# Patient Record
Sex: Female | Born: 1995 | Race: White | Hispanic: No | Marital: Single | Smoking: Never smoker
Health system: Southern US, Community
[De-identification: ages and names within clinical notes are randomized; demographics above are authoritative.]

## PROBLEM LIST (undated history)

## (undated) DIAGNOSIS — R569 Unspecified convulsions: Secondary | ICD-10-CM

## (undated) DIAGNOSIS — I219 Acute myocardial infarction, unspecified: Secondary | ICD-10-CM

## (undated) DIAGNOSIS — F419 Anxiety disorder, unspecified: Secondary | ICD-10-CM

## (undated) HISTORY — DX: Anxiety disorder, unspecified: F41.9

## (undated) HISTORY — DX: Unspecified convulsions: R56.9

## (undated) HISTORY — PX: WISDOM TOOTH EXTRACTION: SHX21

## (undated) HISTORY — DX: Acute myocardial infarction, unspecified: I21.9

---
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
## 2021-07-25 ENCOUNTER — Encounter: Payer: Self-pay | Admitting: Physician Assistant

## 2021-07-25 ENCOUNTER — Ambulatory Visit (INDEPENDENT_AMBULATORY_CARE_PROVIDER_SITE_OTHER): Payer: BC Managed Care – PPO | Admitting: Physician Assistant

## 2021-07-25 VITALS — BP 123/74 | HR 99 | Temp 99.3°F | Resp 16 | Ht 61.0 in | Wt 108.3 lb

## 2021-07-25 DIAGNOSIS — R569 Unspecified convulsions: Secondary | ICD-10-CM | POA: Diagnosis not present

## 2021-07-25 DIAGNOSIS — H6593 Unspecified nonsuppurative otitis media, bilateral: Secondary | ICD-10-CM | POA: Diagnosis not present

## 2021-07-25 DIAGNOSIS — F419 Anxiety disorder, unspecified: Secondary | ICD-10-CM | POA: Diagnosis not present

## 2021-07-25 DIAGNOSIS — J301 Allergic rhinitis due to pollen: Secondary | ICD-10-CM | POA: Diagnosis not present

## 2021-07-25 DIAGNOSIS — Z7689 Persons encountering health services in other specified circumstances: Secondary | ICD-10-CM

## 2021-07-25 MED ORDER — FLUTICASONE PROPIONATE 50 MCG/ACT NA SUSP: 2.0000 | Freq: Every day | NASAL | 6 refills | Status: DC

## 2021-07-25 NOTE — Progress Notes (Signed)
? ?I,Andrea Poole,acting as a Neurosurgeon for [employer], PA-C.,have documented all relevant documentation on the behalf of Andrea Lat, PA-C,as directed by  [employer], PA-C while in the presence of [employer], PA-C. ? ?New Patient Office Visit ? ?Subjective   ? ?Patient ID: Andrea Poole, female    DOB: Jun 20, 1995  Age: 26 y.o. MRN: [id number] ? ?CC:  ?Chief Complaint  ?Patient presents with  ? Establish Care  ? ? ?HPI ?[address] [place] presents to establish care. Reports no complains or concerns.   ?Patient has never had pap. LMP 07-21-21  ?Patient states she had Tetanus in 2018 at [facility] ? ?Anxiety ?Takes Celexa. Compliant with medication. ? ?Seasonal  allergy ?Endorses having nasal congestion, rhinorrhea, ear fullness and pressure. Denies having fever, sinus pressure or pain. Denies having watery eye discharge or itchy eyes. ? ?Seizure ?Was diagnosed with seizure in 2016 after an episode of seizure was witnessed by her mother. Her recent episode of seizure was 2 months ago. She cannot drive due to 6 mo restriction per [facility] statutes ? ? ? ?  07/25/2021  ? 10:09 AM  ?GAD 7 : Generalized Anxiety Score  ?Nervous, Anxious, on Edge 0  ?Control/stop worrying 0  ?Worry too much - different things 0  ?Trouble relaxing 0  ?Restless 0  ?Easily annoyed or irritable 0  ?Afraid - awful might happen 0  ?Total GAD 7 Score 0  ?Anxiety Difficulty Not difficult at all  ? ?  ?Outpatient Encounter Medications as of 07/25/2021  ?Medication Sig  ? citalopram (CELEXA) 20 MG tablet Take 20 tablets by mouth 1 day or 1 dose.  ? ibuprofen (ADVIL,MOTRIN) 200 MG tablet Take 200 mg by mouth every 6 (six) hours as needed.  ? lamoTRIgine (LAMICTAL) 200 MG tablet Take 200 tablets by mouth in the morning and at bedtime.  ? lamoTRIgine (LAMICTAL) 25 MG tablet Take 50 mg by mouth daily.  ? norgestimate-ethinyl estradiol (ORTHO-CYCLEN) 0.25-35 MG-MCG tablet   ? levETIRAcetam (KEPPRA) 500 MG tablet  Take 1 tablet (500 mg total) by mouth 2 (two) times daily.  ? ?No facility-administered encounter medications on file as of 07/25/2021.  ? ? ?Past Medical History:  ?Diagnosis Date  ? Anxiety   ? Seizures (HCC)   ? ? ?Past Surgical History:  ?Procedure Laterality Date  ? WISDOM TOOTH EXTRACTION    ? ? ?Family History  ?Problem Relation Age of Onset  ? Diabetes Mother   ? Diabetes Maternal Grandmother   ? ? ?Social History  ? ?Socioeconomic History  ? Marital status: Single  ?  Spouse name: Not on file  ? Number of children: Not on file  ? Years of education: Not on file  ? Highest education level: Not on file  ?Occupational History  ? Not on file  ?Tobacco Use  ? Smoking status: Never  ? Smokeless tobacco: Not on file  ?Substance and Sexual Activity  ? Alcohol use: No  ? Drug use: No  ? Sexual activity: Yes  ?  Birth control/protection: Pill  ?Other Topics Concern  ? Not on file  ?Social History Narrative  ? Not on file  ? ?Social Determinants of Health  ? ?Financial Resource Strain: Not on file  ?Food Insecurity: Not on file  ?Transportation Needs: Not on file  ?Physical Activity: Not on file  ?Stress: Not on file  ?Social Connections: Not on file  ?Intimate Partner Violence: Not on file  ? ? ?Review of Systems  ?  Constitutional: Negative.   ?Respiratory: Negative.    ?Cardiovascular: Negative.   ?Gastrointestinal: Negative.   ?Genitourinary: Negative.   ?All other systems reviewed and are negative. ? ? Except HPI ? ? ?Objective   ? ?BP 123/74 (BP Location: Right Arm, Patient Position: Sitting, Cuff Size: Normal)   Pulse 99   Temp 99.3 ?F (37.4 ?C) (Oral)   Resp 16   Ht 5\' 1"  (1.549 m)   Wt 108 lb 4.8 oz (49.1 kg)   LMP 07/20/2021 Comment: monthly  SpO2 100%   BMI 20.46 kg/m?  ? ?Physical Exam ?Vitals and nursing note reviewed.  ?Constitutional:   ?   Appearance: Normal appearance.  ?HENT:  ?   Head: Normocephalic and atraumatic.  ?   Ears:  ?   Comments: Fluids behind TMs ?   Nose: Congestion and rhinorrhea  present.  ?   Mouth/Throat:  ?   Mouth: Mucous membranes are moist.  ?   Pharynx: No oropharyngeal exudate.  ?Eyes:  ?   Extraocular Movements: Extraocular movements intact.  ?   Conjunctiva/sclera: Conjunctivae normal.  ?   Pupils: Pupils are equal, round, and reactive to light.  ?Cardiovascular:  ?   Rate and Rhythm: Normal rate and regular rhythm.  ?   Pulses: Normal pulses.  ?   Heart sounds: Normal heart sounds.  ?Pulmonary:  ?   Effort: Pulmonary effort is normal.  ?   Breath sounds: Normal breath sounds.  ?Abdominal:  ?   General: Abdomen is flat. Bowel sounds are normal.  ?   Palpations: Abdomen is soft.  ?Musculoskeletal:     ?   General: Normal range of motion.  ?   Cervical back: Normal range of motion.  ?Neurological:  ?   Mental Status: She is alert and oriented to person, place, and time.  ?Psychiatric:     ?   Behavior: Behavior normal.     ?   Thought Content: Thought content normal.     ?   Judgment: Judgment normal.  ? ?Assessment & Plan:  ? ?Problem List Items Addressed This Visit   ?None ?Visit Diagnoses   ? ? Anxiety    -  Primary  ? Relevant Medications  ? citalopram (CELEXA) 20 MG tablet  ? Seizures (HCC)      ? Relevant Medications  ? lamoTRIgine (LAMICTAL) 200 MG tablet  ? lamoTRIgine (LAMICTAL) 25 MG tablet  ? Seasonal allergic rhinitis due to pollen      ? Otitis media with effusion, bilateral      ? Encounter to establish care      ? ?  ?1. Anxiety ?GAD7 score is 0 ?Chronic. Stable ?Continue Celexa ? ?2. Seizures (HCC) ?Continue Lamotrigine ?Managed by Neurology. ? ?3. Seasonal allergic rhinitis due to pollen ?Advised taking OTC antihistamines daily. ?Nasal irrigation with saline.  ?Intranasal corticosteroid ? ?4. Otitis media with effusion, bilateral ?Nasal irrigation with saline. ?Intranasal corticosteroid ? ?5. Encounter to establish care ? ?CPE in 3 mo. Pap smear and Labs if needed ? ?The patient was advised to call back or seek an in-person evaluation if the symptoms worsen or if the  condition fails to improve as anticipated. ? ?I discussed the assessment and treatment plan with the patient. The patient was provided an opportunity to ask questions and all were answered. The patient agreed with the plan and demonstrated an understanding of the instructions. ? ?The entirety of the information documented in the History of Present Illness, Review of Systems  and Physical Exam were personally obtained by me. Portions of this information were initially documented by the CMA and reviewed by me for thoroughness and accuracy.   ? ?Portions of this note were created using dictation software and may contain typographical errors.   ? ?Andrea LatJanna Imanuel Pruiett, PA-C ? ? ?

## 2021-07-25 NOTE — Patient Instructions (Signed)
  °  Per [place] DMV statutes, patients with seizures are not allowed to drive until they have been seizure-free for six months. Use caution when using heavy equipment or power tools. Avoid working on ladders or at heights. Take showers instead of baths. Ensure the water temperature is not too high on the home water heater. Do not go swimming alone. When caring for infants or small children, sit down when holding, feeding, or changing them to minimize risk of injury to the child in the event you have a seizure. °

## 2021-10-01 ENCOUNTER — Telehealth: Payer: Self-pay

## 2021-10-01 DIAGNOSIS — G40909 Epilepsy, unspecified, not intractable, without status epilepticus: Secondary | ICD-10-CM

## 2021-10-01 NOTE — Telephone Encounter (Unsigned)
Copied from CRM [id number]. Topic: Referral - Question >> Oct 01, 2021  3:59 PM Tiffany B wrote: Patient would like to be referred to [facility] either [facility] or [facility] due to her seizures. Patient states she currently sees DeForest E. Malvin Johns, MD at [facility]. Patient states her last seizure was last  Wednesday and she saw her Neurologist. Patient states she has been seeing Dr. Malvin Johns for awhile but he stopping doing test therefore she would like PCP to refer to a new neurologist. Please advise

## 2021-10-02 ENCOUNTER — Other Ambulatory Visit: Payer: Self-pay | Admitting: Physician Assistant

## 2021-10-02 NOTE — Telephone Encounter (Signed)
Referral was placed. Please, let pt know. Thank you

## 2021-10-02 NOTE — Addendum Note (Signed)
Addended by: Debera Lat on: 10/02/2021 03:02 PM   Modules accepted: Orders

## 2021-10-03 ENCOUNTER — Other Ambulatory Visit: Payer: Self-pay | Admitting: Physician Assistant

## 2021-10-03 DIAGNOSIS — Z30019 Encounter for initial prescription of contraceptives, unspecified: Secondary | ICD-10-CM

## 2021-10-03 MED ORDER — NORGESTIMATE-ETH ESTRADIOL 0.25-35 MG-MCG PO TABS: 1.0000 | ORAL_TABLET | Freq: Every day | ORAL | 11 refills | Status: DC

## 2021-10-03 NOTE — Telephone Encounter (Signed)
Pt is requesting that Strintec 28 day pack be sent to [facility] [address] [place] - [address]  [address] [place] [address]  Phone:  [phone]  Fax:  [phone]  She states that you have never prescribed this for her but it was talked about at last office visit

## 2021-10-03 NOTE — Telephone Encounter (Signed)
Pt informed

## 2021-10-03 NOTE — Telephone Encounter (Signed)
Patient advised.

## 2021-10-03 NOTE — Telephone Encounter (Signed)
Please, let pt know that her meds were sent to her pharmacy

## 2021-10-31 ENCOUNTER — Encounter: Payer: BC Managed Care – PPO | Admitting: Physician Assistant

## 2021-10-31 NOTE — Progress Notes (Deleted)
I,[name],acting as a Neurosurgeon for [employer], PA-C.,have documented all relevant documentation on the behalf of Debera Lat, PA-C,as directed by  [employer], PA-C while in the presence of [employer], PA-C.   Complete physical exam   Patient: Andrea Poole   DOB: 1996/03/23   25 y.o. Female  MRN: [id number] Visit Date: 10/31/2021  Today's healthcare provider: Debera Lat, PA-C   No chief complaint on file.  Subjective    Andrea Poole is a 26 y.o. female who presents today for a complete physical exam.  She reports consuming a {diet types:17450} diet. {Exercise:19826} She generally feels {well/fairly well/poorly:18703}. She reports sleeping {well/fairly well/poorly:18703}. She {does/does not:200015} have additional problems to discuss today.  Pap today.   Past Medical History:  Diagnosis Date   Anxiety    Seizures (HCC)    Past Surgical History:  Procedure Laterality Date   WISDOM TOOTH EXTRACTION     Social History   Socioeconomic History   Marital status: Single    Spouse name: Not on file   Number of children: Not on file   Years of education: Not on file   Highest education level: Not on file  Occupational History   Not on file  Tobacco Use   Smoking status: Never   Smokeless tobacco: Not on file  Substance and Sexual Activity   Alcohol use: No   Drug use: No   Sexual activity: Yes    Birth control/protection: Pill  Other Topics Concern   Not on file  Social History Narrative   Not on file   Social Determinants of Health   Financial Resource Strain: Not on file  Food Insecurity: Not on file  Transportation Needs: Not on file  Physical Activity: Not on file  Stress: Not on file  Social Connections: Not on file  Intimate Partner Violence: Not on file   Family Status  Relation Name Status   Mother  (Not Specified)   MGM  (Not Specified)   Family History  Problem Relation Age of Onset   Diabetes Mother     Diabetes Maternal Grandmother    Allergies  Allergen Reactions   Amoxicillin Rash    Patient Care Team: Debera Lat, PA-C as PCP - General (Physician Assistant)   Medications: Outpatient Medications Prior to Visit  Medication Sig   citalopram (CELEXA) 20 MG tablet Take 20 tablets by mouth 1 day or 1 dose.   fluticasone (FLONASE) 50 MCG/ACT nasal spray Place 2 sprays into both nostrils daily.   ibuprofen (ADVIL,MOTRIN) 200 MG tablet Take 200 mg by mouth every 6 (six) hours as needed.   lamoTRIgine (LAMICTAL) 200 MG tablet Take 200 tablets by mouth in the morning and at bedtime.   lamoTRIgine (LAMICTAL) 25 MG tablet Take 50 mg by mouth daily.   levETIRAcetam (KEPPRA) 500 MG tablet Take 1 tablet (500 mg total) by mouth 2 (two) times daily.   norgestimate-ethinyl estradiol (SPRINTEC 28) 0.25-35 MG-MCG tablet Take 1 tablet by mouth daily.   No facility-administered medications prior to visit.    Review of Systems  {Labs  Heme  Chem  Endocrine  Serology  Results Review (optional):23779}  Objective    There were no vitals taken for this visit. {Show previous vital signs (optional):23777}   Physical Exam  ***  Last depression screening scores    07/25/2021   10:00 AM  PHQ 2/9 Scores  PHQ - 2 Score 0  PHQ- 9 Score 0   Last fall  risk screening    07/25/2021   10:00 AM  Fall Risk   Falls in the past year? 0  Number falls in past yr: 0  Injury with Fall? 0  Follow up Falls evaluation completed   Last Audit-C alcohol use screening    07/25/2021   10:00 AM  Alcohol Use Disorder Test (AUDIT)  1. How often do you have a drink containing alcohol? 1  2. How many drinks containing alcohol do you have on a typical day when you are drinking? 0  3. How often do you have six or more drinks on one occasion? 1  AUDIT-C Score 2   A score of 3 or more in women, and 4 or more in men indicates increased risk for alcohol abuse, EXCEPT if all of the points are from question 1    No results found for any visits on 10/31/21.  Assessment & Plan    Routine Health Maintenance and Physical Exam  Exercise Activities and Dietary recommendations  Goals   None      There is no immunization history on file for this patient.  Health Maintenance  Topic Date Due   HPV VACCINES (1 - 2-dose series) Never done   CHLAMYDIA SCREENING  Never done   HIV Screening  Never done   Hepatitis C Screening  Never done   TETANUS/TDAP  Never done   PAP-Cervical Cytology Screening  Never done   PAP SMEAR-Modifier  Never done   INFLUENZA VACCINE  11/04/2021    Discussed health benefits of physical activity, and encouraged her to engage in regular exercise appropriate for her age and condition.  ***  No follow-ups on file.     {provider attestation***:1}   Debera Lat, Cordelia Poche  [facility] [phone] (phone) [phone] (fax)  [facility]

## 2022-03-01 ENCOUNTER — Emergency Department
Admission: EM | Admit: 2022-03-01 | Discharge: 2022-03-01 | Disposition: A | Discharge status: Home / Self Care | Payer: Self-pay | Attending: Emergency Medicine | Admitting: Emergency Medicine

## 2022-03-01 ENCOUNTER — Other Ambulatory Visit: Payer: Self-pay

## 2022-03-01 DIAGNOSIS — R569 Unspecified convulsions: Secondary | ICD-10-CM | POA: Insufficient documentation

## 2022-03-01 LAB — CBC WITH DIFFERENTIAL/PLATELET
Abs Immature Granulocytes: 0.06 10*3/uL (ref 0.00–0.07)
Basophils Absolute: 0.1 10*3/uL (ref 0.0–0.1)
Basophils Relative: 1 %
Eosinophils Absolute: 0.4 10*3/uL (ref 0.0–0.5)
Eosinophils Relative: 3 %
HCT: 39 % (ref 36.0–46.0)
Hemoglobin: 12.8 g/dL (ref 12.0–15.0)
Immature Granulocytes: 1 %
Lymphocytes Relative: 37 %
Lymphs Abs: 4.1 10*3/uL — ABNORMAL HIGH (ref 0.7–4.0)
MCH: 28.9 pg (ref 26.0–34.0)
MCHC: 32.8 g/dL (ref 30.0–36.0)
MCV: 88 fL (ref 80.0–100.0)
Monocytes Absolute: 0.8 10*3/uL (ref 0.1–1.0)
Monocytes Relative: 7 %
Neutro Abs: 5.7 10*3/uL (ref 1.7–7.7)
Neutrophils Relative %: 51 %
Platelets: 363 10*3/uL (ref 150–400)
RBC: 4.43 MIL/uL (ref 3.87–5.11)
RDW: 12.6 % (ref 11.5–15.5)
WBC: 11.2 10*3/uL — ABNORMAL HIGH (ref 4.0–10.5)
nRBC: 0 % (ref 0.0–0.2)

## 2022-03-01 LAB — COMPREHENSIVE METABOLIC PANEL
ALT: 11 U/L (ref 0–44)
AST: 35 U/L (ref 15–41)
Albumin: 4.1 g/dL (ref 3.5–5.0)
Alkaline Phosphatase: 52 U/L (ref 38–126)
Anion gap: 17 — ABNORMAL HIGH (ref 5–15)
BUN: 9 mg/dL (ref 6–20)
CO2: 14 mmol/L — ABNORMAL LOW (ref 22–32)
Calcium: 8.8 mg/dL — ABNORMAL LOW (ref 8.9–10.3)
Chloride: 106 mmol/L (ref 98–111)
Creatinine, Ser: 0.93 mg/dL (ref 0.44–1.00)
GFR, Estimated: >60 mL/min (ref >60)
Glucose, Bld: 130 mg/dL — ABNORMAL HIGH (ref 70–99)
Potassium: 3.9 mmol/L (ref 3.5–5.1)
Sodium: 137 mmol/L (ref 135–145)
Total Bilirubin: 0.4 mg/dL (ref 0.3–1.2)
Total Protein: 7.5 g/dL (ref 6.5–8.1)

## 2022-03-01 LAB — URINALYSIS, ROUTINE W REFLEX MICROSCOPIC
Bacteria, UA: NONE SEEN
Bilirubin Urine: NEGATIVE
Glucose, UA: NEGATIVE mg/dL
Ketones, ur: NEGATIVE mg/dL
Nitrite: NEGATIVE
Protein, ur: 30 mg/dL — AB
Specific Gravity, Urine: 1.013 (ref 1.005–1.030)
pH: 5 (ref 5.0–8.0)

## 2022-03-01 LAB — LACTIC ACID, PLASMA: Lactic Acid, Venous: >9 mmol/L (ref 0.5–1.9)

## 2022-03-01 LAB — POC URINE PREG, ED: Preg Test, Ur: NEGATIVE

## 2022-03-01 NOTE — ED Provider Notes (Addendum)
[facility] Provider Note    None    (approximate)   History   Seizures   HPI  [address] Andrea Poole is a 26 y.o. female   Past medical history of seizures followed by neurology Dr. Malvin Johns on Lamictal who presents for seizure from work.  She has been compliant with all medications.  No recent illnesses.  No drug or alcohol use.  She gets seizures breakthrough every several months.  Her last seizure was 2 months ago.  Generalized tonic-clonic seizure with tongue bite, her right thumb nail is partially avulsed, and she has some small bruising on her forehead from her fall.  EMS reports the glucose was normal and she was postictal but rapidly resolving.  History was obtained via the patient. EMS is present as pending historian to offer history as above.      Physical Exam   Triage Vital Signs: ED Triage Vitals  Enc Vitals Group     BP      Pulse      Resp      Temp      Temp src      SpO2      Weight      Height      Head Circumference      Peak Flow      Pain Score      Pain Loc      Pain Edu?      Excl. in GC?     Most recent vital signs: Vitals:   03/01/22 0850 03/01/22 0930  BP: 104/71 99/67  Pulse: 94 84  Resp: 14 14  Temp:    SpO2: 95% 100%    General: Awake, no distress.  CV:  Good peripheral perfusion.  Resp:  Normal effort.  Abd:  No distention.  Other:  Partially avulsed fingernail on the right thumb, no bony tenderness, full active range of motion neurovascular intact.  Small hematoma to the right forehead.  Alert and oriented and pleasant.  Moving all extremities.  Lungs clear and abdomen soft nontender.   ED Results / Procedures / Treatments   Labs (all labs ordered are listed, but only abnormal results are displayed) Labs Reviewed  COMPREHENSIVE METABOLIC PANEL - Abnormal; Notable for the following components:      Result Value   CO2 14 (*)    Glucose, Bld 130 (*)    Calcium 8.8 (*)    Anion gap 17 (*)     All other components within normal limits  LACTIC ACID, PLASMA - Abnormal; Notable for the following components:   Lactic Acid, Venous >9.0 (*)    All other components within normal limits  CBC WITH DIFFERENTIAL/PLATELET - Abnormal; Notable for the following components:   WBC 11.2 (*)    Lymphs Abs 4.1 (*)    All other components within normal limits  URINALYSIS, ROUTINE W REFLEX MICROSCOPIC - Abnormal; Notable for the following components:   Color, Urine STRAW (*)    APPearance HAZY (*)    Hgb urine dipstick SMALL (*)    Protein, ur 30 (*)    Leukocytes,Ua TRACE (*)    All other components within normal limits  LAMOTRIGINE LEVEL  POC URINE PREG, ED     I reviewed labs and they are notable for glucose is 130  PROCEDURES:  Critical Care performed: No  Procedures   MEDICATIONS ORDERED IN ED: Medications - No data to display   IMPRESSION / MDM / ASSESSMENT AND  PLAN / ED COURSE  I reviewed the triage vital signs and the nursing notes.                              Differential diagnosis includes, but is not limited to, seizure, infection, electrolyte derangement, traumatic injuries    MDM: Patient with history of seizures compliant with medications who had a breakthrough seizure.  She is postictal but rapidly improving.  Small hematoma on her forehead but alert and oriented no neurologic deficits and is healthy outpatient defer head imaging at this time.  Fingernail is partially avulsed and I gave her anticipatory guidance about expectations, follow-up.  Check basic labs, urinalysis, pregnancy, and send out Lamictal level.  She has a follow-up with neurologist scheduled already, referral placed to [facility] per patient preference.  Continue to monitor and check work-up as above, anticipate discharge.  Patient back to baseline.  Lactic acidosis in the setting of seizure, expected.  Labs otherwise unremarkable.  Patient without other complaints and has a safe ride home.  Plan for  discharge with return precautions and neurologist follow-up as above.  Patient's presentation is most consistent with acute presentation with potential threat to life or bodily function.       FINAL CLINICAL IMPRESSION(S) / ED DIAGNOSES   Final diagnoses:  Seizure (HCC)     Rx / DC Orders   ED Discharge Orders     None        Note:  This document was prepared using Dragon voice recognition software and may include unintentional dictation errors.    Pilar Jarvis, MD 03/01/22 6226    Pilar Jarvis, MD 03/01/22 [phone]

## 2022-03-01 NOTE — ED Triage Notes (Signed)
Patient coming from work after witnessed seizure x 5 mins.  Hx of seizures takes lamictal and took it this am.  Patient repeating questions but oriented x 4.  Patient has injury to right thumb nail and abrasion to right forehead and did bite her tongue.  No incontinence noted and doesn't remember when she had her last seizure.

## 2022-03-01 NOTE — Discharge Instructions (Addendum)
Follow-up with your neurologist as scheduled.  Continue taking all your medications as prescribed.  Check your MyChart for the results of your seizure medication levels and discuss with your neurologist.  Take acetaminophen 650 mg and ibuprofen 400 mg every 6 hours for pain.  Take with food.   Thank you for choosing [place] for your health care today!  Please see your primary doctor this week for a follow up appointment.   If you do not have a primary doctor call the following clinics to establish care:  If you have insurance:  [facility] [phone] [address] [place] [place] [address]   [facility]  [phone] [address] [place] [place] [address]   If you do not have insurance:  [facility]  [phone] [address], [place] [address]  Sometimes, in the early stages of certain disease courses it is difficult to detect in the emergency department evaluation -- so, it is important that you continue to monitor your symptoms and call your doctor right away or return to the emergency department if you develop any new or worsening symptoms.  It was my pleasure to care for you today.   Daneil Dan Modesto Charon, MD

## 2022-03-03 LAB — LAMOTRIGINE LEVEL: Lamotrigine Lvl: 5 ug/mL (ref 2.0–20.0)

## 2022-07-08 ENCOUNTER — Ambulatory Visit: Payer: BC Managed Care – PPO | Admitting: Physician Assistant

## 2022-07-08 NOTE — Progress Notes (Deleted)
   Established patient visit   Patient: Andrea Poole   DOB: 1995-05-05   26 y.o. Female  MRN: [id number] Visit Date: 07/08/2022  Today's healthcare provider: Mardene Speak, PA-C   No chief complaint on file.  Subjective    HPI  Patient is a 27 year old female who presents for evaluation of weight gain.  Medications: Outpatient Medications Prior to Visit  Medication Sig   citalopram (CELEXA) 20 MG tablet Take 20 tablets by mouth 1 day or 1 dose.   fluticasone (FLONASE) 50 MCG/ACT nasal spray Place 2 sprays into both nostrils daily.   ibuprofen (ADVIL,MOTRIN) 200 MG tablet Take 200 mg by mouth every 6 (six) hours as needed.   lamoTRIgine (LAMICTAL) 200 MG tablet Take 200 tablets by mouth in the morning and at bedtime.   lamoTRIgine (LAMICTAL) 25 MG tablet Take 50 mg by mouth daily.   levETIRAcetam (KEPPRA) 500 MG tablet Take 1 tablet (500 mg total) by mouth 2 (two) times daily.   norgestimate-ethinyl estradiol (SPRINTEC 28) 0.25-35 MG-MCG tablet Take 1 tablet by mouth daily.   No facility-administered medications prior to visit.    Review of Systems  {Labs  Heme  Chem  Endocrine  Serology  Results Review (optional):23779}   Objective    There were no vitals taken for this visit. {Show previous vital signs (optional):23777}  Physical Exam  ***  No results found for any visits on 07/08/22.  Assessment & Plan     ***  No follow-ups on file.      {provider attestation***:1}   Mardene Speak, PA-C  [facility] [phone] (phone) [phone] (fax)  [place]

## 2022-09-03 ENCOUNTER — Other Ambulatory Visit: Payer: Self-pay | Admitting: Physician Assistant

## 2022-09-03 DIAGNOSIS — Z30019 Encounter for initial prescription of contraceptives, unspecified: Secondary | ICD-10-CM

## 2022-10-15 DIAGNOSIS — F419 Anxiety disorder, unspecified: Secondary | ICD-10-CM | POA: Diagnosis not present

## 2022-10-15 DIAGNOSIS — G40309 Generalized idiopathic epilepsy and epileptic syndromes, not intractable, without status epilepticus: Secondary | ICD-10-CM | POA: Diagnosis not present

## 2022-11-30 ENCOUNTER — Ambulatory Visit (INDEPENDENT_AMBULATORY_CARE_PROVIDER_SITE_OTHER): Payer: 59 | Admitting: Family Medicine

## 2022-11-30 ENCOUNTER — Encounter: Payer: Self-pay | Admitting: Family Medicine

## 2022-11-30 VITALS — BP 124/77 | HR 94 | Ht 61.0 in | Wt 135.3 lb

## 2022-11-30 DIAGNOSIS — R739 Hyperglycemia, unspecified: Secondary | ICD-10-CM | POA: Diagnosis not present

## 2022-11-30 DIAGNOSIS — L282 Other prurigo: Secondary | ICD-10-CM | POA: Diagnosis not present

## 2022-11-30 DIAGNOSIS — R635 Abnormal weight gain: Secondary | ICD-10-CM

## 2022-11-30 NOTE — Progress Notes (Signed)
Established patient visit   Patient: Andrea Poole   DOB: 07-11-1995   26 y.o. Female  MRN: [id number] Visit Date: 11/30/2022  Today's healthcare provider: Mila Merry, MD   Chief Complaint  Patient presents with   Insect Bite    Patient reports located on outer legs bilateral. Reports she noticed Friday and were red at the time but now black and blue. States she was in [place] when she first noticed.    Subjective    Discussed the use of AI scribe software for clinical note transcription with the patient, who gave verbal consent to proceed.  History of Present Illness   The patient, with a history of seizures and currently on citalopram and trazodone, presents with concerns about an itchy rash characterized by itchy, swollen spots on her legs, which appeared three days prior to the consultation. The rash has been gradually increasing in size and has a small amount of clear drainage.   She also reports gaining approximately 30 pounds over the past six months. She also notes increased bruising and scarring from insect bites, which she describes as taking longer than usual to heal. She also reports feeling tired all the time, increased urination, and increased hunger. Given a family history of diabetes, she expresses concern about the possibility of prediabetes.  The patient also mentions a recent change in her medication regimen. Her neurologist prescribed trazodone about a month ago to help with sleep issues related to her seizures. She takes trazodone every two to three nights to avoid dependency. The dosage of her citalopram was also recently reduced from 30mg  to 20mg .       Medications: Outpatient Medications Prior to Visit  Medication Sig   citalopram (CELEXA) 20 MG tablet Take 20 tablets by mouth 1 day or 1 dose.   fluticasone (FLONASE) 50 MCG/ACT nasal spray Place 2 sprays into both nostrils daily.   ibuprofen (ADVIL,MOTRIN) 200 MG tablet Take 200 mg by mouth  every 6 (six) hours as needed.   lamoTRIgine (LAMICTAL) 200 MG tablet Take 200 tablets by mouth in the morning and at bedtime.   lamoTRIgine (LAMICTAL) 25 MG tablet Take 50 mg by mouth daily.   levETIRAcetam (KEPPRA) 500 MG tablet Take 1 tablet (500 mg total) by mouth 2 (two) times daily.   norgestimate-ethinyl estradiol (SPRINTEC 28) 0.25-35 MG-MCG tablet Take 1 tablet by mouth daily.   traZODone (DESYREL) 50 MG tablet Take 50 mg by mouth.   No facility-administered medications prior to visit.   (optional):1}   Objective    BP 124/77 (BP Location: Left Arm, Patient Position: Sitting, Cuff Size: Normal)   Pulse 94   Ht 5\' 1"  (1.549 m)   Wt 135 lb 4.8 oz (61.4 kg)   BMI 25.56 kg/m   Physical Exam   General appearance: Well developed, well nourished female, cooperative and in no acute distress Head: Normocephalic, without obvious abnormality, atraumatic Respiratory: Respirations even and unlabored, normal respiratory rate Extremities: All extremities are intact.  Skin:  Several small papular lesions scattered on thighs surround by about 12mm area of pale skin surrounded by mild bruising.  Psych: Appropriate mood and affect. Neurologic: Mental status: Alert, oriented to person, place, and time, thought content appropriate.    Assessment & Plan     Assessment and Plan    Papular urticaria, likely Insect Bites Multiple pruritic, swollen, erythematous papules with some pus noted. Likely secondary to insect bites. -Apply Benadryl gel as needed for itching.  Unexplained Weight Gain Recent significant weight gain over the past six months. Patient reports fatigue, increased urination, and increased hunger. Family history of diabetes. -Order comprehensive metabolic panel, A1c, cortisol, and thyroid function tests.        Mila Merry, MD  [facility] [phone] (phone) [phone] (fax)  [facility]

## 2022-11-30 NOTE — Patient Instructions (Addendum)
Please review the attached list of medications and notify my office if there are any errors.   Apply OTC Benadryl gel to the skin lesions on your legs.Call if not clearing up in 7-10 days.   Please go to the lab draw station in [address] on the second floor of [facility]. Normal hours are 8:00am to 11:30am and 1:00pm to 4:00pm Monday through Friday

## 2022-12-22 DIAGNOSIS — R635 Abnormal weight gain: Secondary | ICD-10-CM | POA: Diagnosis not present

## 2022-12-22 DIAGNOSIS — R739 Hyperglycemia, unspecified: Secondary | ICD-10-CM | POA: Diagnosis not present

## 2022-12-23 ENCOUNTER — Telehealth: Payer: Self-pay

## 2022-12-23 ENCOUNTER — Encounter: Payer: Self-pay | Admitting: Family Medicine

## 2022-12-23 ENCOUNTER — Other Ambulatory Visit: Payer: Self-pay | Admitting: Family Medicine

## 2022-12-23 DIAGNOSIS — E785 Hyperlipidemia, unspecified: Secondary | ICD-10-CM

## 2022-12-23 DIAGNOSIS — R739 Hyperglycemia, unspecified: Secondary | ICD-10-CM | POA: Insufficient documentation

## 2022-12-23 DIAGNOSIS — R7989 Other specified abnormal findings of blood chemistry: Secondary | ICD-10-CM | POA: Insufficient documentation

## 2022-12-23 HISTORY — DX: Hyperlipidemia, unspecified: E78.5

## 2022-12-23 MED ORDER — DEXAMETHASONE 1 MG PO TABS: 1.0000 mg | ORAL_TABLET | Freq: Once | ORAL | 0 refills | Status: AC

## 2022-12-23 NOTE — Telephone Encounter (Signed)
Copied from CRM [id number]. Topic: General - Inquiry >> Dec 23, 2022  3:13 PM Lennox Pippins wrote: Patient called to inquire that she would like to switch to Dr Sherrie Mustache as her PCP, she said he was really nice and helpful the last time she saw him.  Patient has been advised Dr Sherrie Mustache is not accepting new patients but patient stated she would like to switch if possible, that she was impressed with him.  Patient does have an OV with PCP Debera Lat tomorrow 12/24/2022   Patients callback # [phone]

## 2022-12-24 ENCOUNTER — Encounter: Payer: Self-pay | Admitting: Physician Assistant

## 2022-12-24 ENCOUNTER — Ambulatory Visit (INDEPENDENT_AMBULATORY_CARE_PROVIDER_SITE_OTHER): Payer: 59 | Admitting: Physician Assistant

## 2022-12-24 VITALS — BP 116/79 | HR 89 | Ht 61.0 in | Wt 138.8 lb

## 2022-12-24 DIAGNOSIS — F32A Depression, unspecified: Secondary | ICD-10-CM

## 2022-12-24 DIAGNOSIS — Z8249 Family history of ischemic heart disease and other diseases of the circulatory system: Secondary | ICD-10-CM

## 2022-12-24 DIAGNOSIS — R635 Abnormal weight gain: Secondary | ICD-10-CM

## 2022-12-24 DIAGNOSIS — G40909 Epilepsy, unspecified, not intractable, without status epilepticus: Secondary | ICD-10-CM

## 2022-12-24 DIAGNOSIS — F419 Anxiety disorder, unspecified: Secondary | ICD-10-CM

## 2022-12-24 NOTE — Progress Notes (Signed)
Established patient visit  Patient: Andrea Poole   DOB: Feb 20, 1996   27 y.o. Female  MRN: [id number] Visit Date: 12/24/2022  Today's healthcare provider: Debera Lat, PA-C   Chief Complaint  Patient presents with   Follow-up    For rapid weight loss Wants to check on bloodwork done on 12/22/22 and concerned about diabetes   Subjective        Discussed the use of AI scribe software for clinical note transcription with the patient, who gave verbal consent to proceed.  History of Present Illness   The patient, with a history of seizures, depression, and anxiety, presents with concerns about recent rapid weight gain over the past six months. The patient reports a family history of diabetes and cardiovascular disease, with their father having died from a heart attack. The patient is currently on multiple medications including Lamictal, Keppra, birth control, citalopram, and trazodone. The patient also mentions dealing with significant stress due to family issues, including a stepfather with Stage 4 cancer. The patient has been trying to improve their lifestyle habits, including attempting to run for 20 minutes a day and improving their diet. However, they admit to having had poor dietary habits in the past, particularly with snacking.           11/30/2022    4:23 PM 07/25/2021   10:00 AM  Depression screen PHQ 2/9  Decreased Interest 1 0  Down, Depressed, Hopeless 1 0  PHQ - 2 Score 2 0  Altered sleeping 2 0  Tired, decreased energy 1 0  Change in appetite 3 0  Feeling bad or failure about yourself  2 0  Trouble concentrating 0 0  Moving slowly or fidgety/restless 0 0  Suicidal thoughts 0 0  PHQ-9 Score 10 0  Difficult doing work/chores Somewhat difficult Not difficult at all      11/30/2022    4:23 PM 07/25/2021   10:09 AM  GAD 7 : Generalized Anxiety Score  Nervous, Anxious, on Edge 1 0  Control/stop worrying 1 0  Worry too much - different things 1 0  Trouble  relaxing 1 0  Restless 0 0  Easily annoyed or irritable 2 0  Afraid - awful might happen 1 0  Total GAD 7 Score 7 0  Anxiety Difficulty Somewhat difficult Not difficult at all    Medications: Outpatient Medications Prior to Visit  Medication Sig   citalopram (CELEXA) 20 MG tablet Take 20 tablets by mouth 1 day or 1 dose.   fluticasone (FLONASE) 50 MCG/ACT nasal spray Place 2 sprays into both nostrils daily.   ibuprofen (ADVIL,MOTRIN) 200 MG tablet Take 200 mg by mouth every 6 (six) hours as needed.   lamoTRIgine (LAMICTAL) 200 MG tablet Take 200 tablets by mouth in the morning and at bedtime.   lamoTRIgine (LAMICTAL) 25 MG tablet Take 50 mg by mouth daily.   levETIRAcetam (KEPPRA) 500 MG tablet Take 1 tablet (500 mg total) by mouth 2 (two) times daily.   norgestimate-ethinyl estradiol (SPRINTEC 28) 0.25-35 MG-MCG tablet Take 1 tablet by mouth daily.   traZODone (DESYREL) 50 MG tablet Take 50 mg by mouth.   No facility-administered medications prior to visit.    Review of Systems  All other systems reviewed and are negative.  Except see HPI       Objective    BP 116/79 (BP Location: Left Arm, Patient Position: Sitting, Cuff Size: Normal)   Pulse 89   Ht 5\' 1"  (1.549 m)  Wt 138 lb 12.5 oz (63 kg)   SpO2 100%   BMI 26.22 kg/m     Physical Exam Vitals reviewed.  Constitutional:      General: She is not in acute distress.    Appearance: Normal appearance. She is well-developed. She is not diaphoretic.  HENT:     Head: Normocephalic and atraumatic.  Eyes:     General: No scleral icterus.    Conjunctiva/sclera: Conjunctivae normal.  Neck:     Thyroid: No thyromegaly.  Cardiovascular:     Rate and Rhythm: Normal rate and regular rhythm.     Pulses: Normal pulses.     Heart sounds: Normal heart sounds. No murmur heard. Pulmonary:     Effort: Pulmonary effort is normal. No respiratory distress.     Breath sounds: Normal breath sounds. No wheezing, rhonchi or rales.   Musculoskeletal:     Cervical back: Neck supple.     Right lower leg: No edema.     Left lower leg: No edema.  Lymphadenopathy:     Cervical: No cervical adenopathy.  Skin:    General: Skin is warm and dry.     Findings: No rash.  Neurological:     Mental Status: She is alert and oriented to person, place, and time. Mental status is at baseline.  Psychiatric:        Mood and Affect: Mood normal.        Behavior: Behavior normal.      No results found for any visits on 12/24/22.  Assessment & Plan     Abnormal weight gain Rapid Weight Gain Recent significant weight gain over the past 6 months. Family history of diabetes and cardiovascular disease. Patient has been under significant stress and acknowledges poor dietary habits. - discussed comprehensive metabolic panel, lipid panel, and HbA1c to assess for metabolic syndrome and diabetes. Pt had high cholesterol levels and high cortisol level/sugar. She was advised to have dexamethasone suppression test. Rx was sent by Dr. Sherrie Mustache on 12/23/22 Potential Cushing Syndrome Elevated cortisol level and rapid weight gain raise suspicion for Cushing syndrome. -Order dexamethasone suppression test. -Based on results, consider referral to endocrinology.  -Advise patient to follow a low cholesterol diet and increase exercise to at least 1 hour daily. -Schedule follow-up appointment within a month to discuss lab results and potential referral to endocrinology.  Depression and Anxiety    11/30/2022    4:23 PM 07/25/2021   10:00 AM  PHQ9 SCORE ONLY  PHQ-9 Total Score 10 0      11/30/2022    4:23 PM 07/25/2021   10:09 AM  GAD 7 : Generalized Anxiety Score  Nervous, Anxious, on Edge 1 0  Control/stop worrying 1 0  Worry too much - different things 1 0  Trouble relaxing 1 0  Restless 0 0  Easily annoyed or irritable 2 0  Afraid - awful might happen 1 0  Total GAD 7 Score 7 0  Anxiety Difficulty Somewhat difficult Not difficult at all    Managed with citalopram (Celexa) and trazodone. Patient reports improvement with current regimen. -Continue current management. -Advise patient to monitor for any changes in mood or mental state.  Seizure disorder [facility]) Seizure Disorder Patient is currently managed by a neurologist and is on Lamictal and Keppra. Recent decrease in seizure frequency. -Continue current management under neurologist. -Advise patient to discuss potential impact of birth control on seizure frequency with neurologist.  Birth Control Patient currently on oral contraceptive. Discussion of potential  impact on seizure frequency and consideration of IUD. -Advise patient to discuss with neurologist and to consider consultation with an OBGYN.     No follow-ups on file.     The patient was advised to call back or seek an in-person evaluation if the symptoms worsen or if the condition fails to improve as anticipated.  I discussed the assessment and treatment plan with the patient. The patient was provided an opportunity to ask questions and all were answered. The patient agreed with the plan and demonstrated an understanding of the instructions.  I, Debera Lat, PA-C have reviewed all documentation for this visit. The documentation on  12/24/22  for the exam, diagnosis, procedures, and orders are all accurate and complete.  Debera Lat, [facility], MMS [facility] [phone] (phone) [phone] (fax)  [facility]

## 2022-12-26 ENCOUNTER — Encounter: Payer: Self-pay | Admitting: Physician Assistant

## 2022-12-30 DIAGNOSIS — Z79899 Other long term (current) drug therapy: Secondary | ICD-10-CM | POA: Diagnosis not present

## 2022-12-30 DIAGNOSIS — F419 Anxiety disorder, unspecified: Secondary | ICD-10-CM | POA: Diagnosis not present

## 2022-12-30 DIAGNOSIS — G40309 Generalized idiopathic epilepsy and epileptic syndromes, not intractable, without status epilepticus: Secondary | ICD-10-CM | POA: Diagnosis not present

## 2023-01-03 NOTE — Progress Notes (Deleted)
  Established patient visit  Patient: Andrea Poole   DOB: Dec 01, 1995   27 y.o. Female  MRN: [id number] Visit Date: 01/05/2023  Today's healthcare provider: Debera Lat, PA-C   No chief complaint on file.  Subjective     Discussed the use of AI scribe software for clinical note transcription with the patient, who gave verbal consent to proceed.  History of Present Illness         Saw by Dr. Malvin Johns on 12/30/22. Was advised med levels check and med regimen.      11/30/2022    4:23 PM 07/25/2021   10:00 AM  Depression screen PHQ 2/9  Decreased Interest 1 0  Down, Depressed, Hopeless 1 0  PHQ - 2 Score 2 0  Altered sleeping 2 0  Tired, decreased energy 1 0  Change in appetite 3 0  Feeling bad or failure about yourself  2 0  Trouble concentrating 0 0  Moving slowly or fidgety/restless 0 0  Suicidal thoughts 0 0  PHQ-9 Score 10 0  Difficult doing work/chores Somewhat difficult Not difficult at all      11/30/2022    4:23 PM 07/25/2021   10:09 AM  GAD 7 : Generalized Anxiety Score  Nervous, Anxious, on Edge 1 0  Control/stop worrying 1 0  Worry too much - different things 1 0  Trouble relaxing 1 0  Restless 0 0  Easily annoyed or irritable 2 0  Afraid - awful might happen 1 0  Total GAD 7 Score 7 0  Anxiety Difficulty Somewhat difficult Not difficult at all    Medications: Outpatient Medications Prior to Visit  Medication Sig   citalopram (CELEXA) 20 MG tablet Take 20 tablets by mouth 1 day or 1 dose.   fluticasone (FLONASE) 50 MCG/ACT nasal spray Place 2 sprays into both nostrils daily.   ibuprofen (ADVIL,MOTRIN) 200 MG tablet Take 200 mg by mouth every 6 (six) hours as needed.   lamoTRIgine (LAMICTAL) 200 MG tablet Take 200 tablets by mouth in the morning and at bedtime.   lamoTRIgine (LAMICTAL) 25 MG tablet Take 50 mg by mouth daily.   levETIRAcetam (KEPPRA) 500 MG tablet Take 1 tablet (500 mg total) by mouth 2 (two) times daily.   norgestimate-ethinyl  estradiol (SPRINTEC 28) 0.25-35 MG-MCG tablet Take 1 tablet by mouth daily.   traZODone (DESYREL) 50 MG tablet Take 50 mg by mouth.   No facility-administered medications prior to visit.    Review of Systems Except see HPI   {Insert previous labs (optional):23779} {See past labs  Heme  Chem  Endocrine  Serology  Results Review (optional):1}   Objective    There were no vitals taken for this visit. {Insert last BP/Wt (optional):23777}{See vitals history (optional):1}   Physical Exam   No results found for any visits on 01/05/23.  Assessment & Plan    *** Assessment and Plan              No follow-ups on file.      [facility]

## 2023-01-05 ENCOUNTER — Ambulatory Visit: Payer: 59 | Admitting: Physician Assistant

## 2023-01-05 DIAGNOSIS — R635 Abnormal weight gain: Secondary | ICD-10-CM

## 2023-01-05 DIAGNOSIS — Z79899 Other long term (current) drug therapy: Secondary | ICD-10-CM

## 2023-01-05 DIAGNOSIS — G40909 Epilepsy, unspecified, not intractable, without status epilepticus: Secondary | ICD-10-CM

## 2023-01-06 ENCOUNTER — Ambulatory Visit: Payer: 59 | Admitting: Physician Assistant

## 2023-02-25 ENCOUNTER — Encounter: Payer: 59 | Admitting: Physician Assistant

## 2023-05-21 DIAGNOSIS — R7989 Other specified abnormal findings of blood chemistry: Secondary | ICD-10-CM | POA: Diagnosis not present

## 2023-06-01 DIAGNOSIS — R569 Unspecified convulsions: Secondary | ICD-10-CM | POA: Diagnosis not present

## 2023-06-01 DIAGNOSIS — F439 Reaction to severe stress, unspecified: Secondary | ICD-10-CM | POA: Diagnosis not present

## 2023-06-01 DIAGNOSIS — F419 Anxiety disorder, unspecified: Secondary | ICD-10-CM | POA: Diagnosis not present

## 2023-06-10 LAB — CORTISOL DEXAMETHASONE REFLEX: Cortisol, Serum LCMS: 5.9 ug/dL — ABNORMAL HIGH

## 2023-06-10 LAB — DEXAMETHASONE, BLOOD: Dexamethasone, Blood: 263 ng/dL

## 2023-06-11 ENCOUNTER — Other Ambulatory Visit: Payer: Self-pay | Admitting: Family Medicine

## 2023-06-11 ENCOUNTER — Encounter: Payer: Self-pay | Admitting: Family Medicine

## 2023-06-11 DIAGNOSIS — E249 Cushing's syndrome, unspecified: Secondary | ICD-10-CM

## 2023-06-20 NOTE — Progress Notes (Unsigned)
 Complete physical exam  Patient: Andrea Poole   DOB: November 28, 1995   27 y.o. Female  MRN: [id number] Visit Date: 06/23/2023  Today's healthcare provider: Debera Lat, PA-C   No chief complaint on file.  Subjective    Andrea Poole is a 28 y.o. female who presents today for a complete physical exam.  She reports consuming a {diet types:17450} diet. {Exercise:19826} She generally feels {well/fairly well/poorly:18703}. She reports sleeping {well/fairly well/poorly:18703}. She {does/does not:200015} have additional problems to discuss today.  HPI  *** Discussed the use of AI scribe software for clinical note transcription with the patient, who gave verbal consent to proceed.  History of Present Illness            Last depression screening scores    11/30/2022    4:23 PM 07/25/2021   10:00 AM  PHQ 2/9 Scores  PHQ - 2 Score 2 0  PHQ- 9 Score 10 0   Last fall risk screening    11/30/2022    4:23 PM  Fall Risk   Falls in the past year? 1  Number falls in past yr: 1  Injury with Fall? 1  Risk for fall due to : History of fall(s)  Follow up Falls evaluation completed   Last Audit-C alcohol use screening    07/07/2022    9:29 PM  Alcohol Use Disorder Test (AUDIT)  1. How often do you have a drink containing alcohol? 2  2. How many drinks containing alcohol do you have on a typical day when you are drinking? 0  3. How often do you have six or more drinks on one occasion? 1  AUDIT-C Score 3  4. How often during the last year have you found that you were not able to stop drinking once you had started? 0  5. How often during the last year have you failed to do what was normally expected from you because of drinking? 0  6. How often during the last year have you needed a first drink in the morning to get yourself going after a heavy drinking session? 0  7. How often during the last year have you had a feeling of guilt of remorse after drinking? 0  8. How  often during the last year have you been unable to remember what happened the night before because you had been drinking? 0  9. Have you or someone else been injured as a result of your drinking? 0  10. Has a relative or friend or a doctor or another health worker been concerned about your drinking or suggested you cut down? 0  Alcohol Use Disorder Identification Test Final Score (AUDIT) 3   A score of 3 or more in women, and 4 or more in men indicates increased risk for alcohol abuse, EXCEPT if all of the points are from question 1   Past Medical History:  Diagnosis Date   Anxiety    Heart attack (HCC)    Hyperlipidemia 12/23/2022   Seizures (HCC)    Past Surgical History:  Procedure Laterality Date   WISDOM TOOTH EXTRACTION     Social History   Socioeconomic History   Marital status: Single    Spouse name: Not on file   Number of children: Not on file   Years of education: Not on file   Highest education level: Bachelor's degree (e.g., BA, AB, BS)  Occupational History   Not on file  Tobacco Use   Smoking status: Never  Smokeless tobacco: Not on file  Substance and Sexual Activity   Alcohol use: No   Drug use: No   Sexual activity: Yes    Birth control/protection: Pill  Other Topics Concern   Not on file  Social History Narrative   Not on file   Social Drivers of Health   Financial Resource Strain: Patient Declined (07/07/2022)   Overall Financial Resource Strain (CARDIA)    Difficulty of Paying Living Expenses: Patient declined  Food Insecurity: Patient Declined (07/07/2022)   Hunger Vital Sign    Worried About Running Out of Food in the Last Year: Patient declined    Ran Out of Food in the Last Year: Patient declined  Transportation Needs: Unknown (07/07/2022)   PRAPARE - Transportation    Lack of Transportation (Medical): No    Lack of Transportation (Non-Medical): Patient declined  Physical Activity: Insufficiently Active (07/07/2022)   Exercise Vital Sign     Days of Exercise per Week: 3 days    Minutes of Exercise per Session: 20 min  Stress: Stress Concern Present (07/07/2022)   [employer] of Occupational Health - Occupational Stress Questionnaire    Feeling of Stress : Rather much  Social Connections: Unknown (07/07/2022)   Social Connection and Isolation Panel [NHANES]    Frequency of Communication with Friends and Family: More than three times a week    Frequency of Social Gatherings with Friends and Family: Once a week    Attends Religious Services: Patient declined    Database administrator or Organizations: No    Attends Engineer, structural: Not on file    Marital Status: Never married  Intimate Partner Violence: Not on file   Family Status  Relation Name Status   Mother  (Not Specified)   MGM  (Not Specified)  No partnership data on file   Family History  Problem Relation Age of Onset   Diabetes Mother    Diabetes Maternal Grandmother    Allergies  Allergen Reactions   Dha-Epa-Vitamin E     Other reaction(s): Vomiting   Fish Oil Nausea And Vomiting   Amoxicillin Rash    Patient Care Team: Debera Lat, PA-C as PCP - General (Physician Assistant)   Medications: Outpatient Medications Prior to Visit  Medication Sig   citalopram (CELEXA) 20 MG tablet Take 20 tablets by mouth 1 day or 1 dose.   fluticasone (FLONASE) 50 MCG/ACT nasal spray Place 2 sprays into both nostrils daily.   ibuprofen (ADVIL,MOTRIN) 200 MG tablet Take 200 mg by mouth every 6 (six) hours as needed.   lamoTRIgine (LAMICTAL) 200 MG tablet Take 200 tablets by mouth in the morning and at bedtime.   lamoTRIgine (LAMICTAL) 25 MG tablet Take 50 mg by mouth daily.   levETIRAcetam (KEPPRA) 500 MG tablet Take 1 tablet (500 mg total) by mouth 2 (two) times daily.   norgestimate-ethinyl estradiol (SPRINTEC 28) 0.25-35 MG-MCG tablet Take 1 tablet by mouth daily.   traZODone (DESYREL) 50 MG tablet Take 50 mg by mouth.   No facility-administered  medications prior to visit.    Review of Systems  All other systems reviewed and are negative.  Except see HPI  {Insert previous labs (optional):23779} {See past labs  Heme  Chem  Endocrine  Serology  Results Review (optional):1}  Objective    There were no vitals taken for this visit. {Insert last BP/Wt (optional):23777}{See vitals history (optional):1}    Physical Exam Vitals reviewed.  Constitutional:      General:  She is not in acute distress.    Appearance: Normal appearance. She is well-developed. She is not ill-appearing, toxic-appearing or diaphoretic.  HENT:     Head: Normocephalic and atraumatic.     Right Ear: Tympanic membrane, ear canal and external ear normal.     Left Ear: Tympanic membrane, ear canal and external ear normal.     Nose: Nose normal. No congestion or rhinorrhea.     Mouth/Throat:     Mouth: Mucous membranes are moist.     Pharynx: Oropharynx is clear. No oropharyngeal exudate.  Eyes:     General: No scleral icterus.       Right eye: No discharge.        Left eye: No discharge.     Conjunctiva/sclera: Conjunctivae normal.     Pupils: Pupils are equal, round, and reactive to light.  Neck:     Thyroid: No thyromegaly.     Vascular: No carotid bruit.  Cardiovascular:     Rate and Rhythm: Normal rate and regular rhythm.     Pulses: Normal pulses.     Heart sounds: Normal heart sounds. No murmur heard.    No friction rub. No gallop.  Pulmonary:     Effort: Pulmonary effort is normal. No respiratory distress.     Breath sounds: Normal breath sounds. No wheezing or rales.  Abdominal:     General: Abdomen is flat. Bowel sounds are normal. There is no distension.     Palpations: Abdomen is soft. There is no mass.     Tenderness: There is no abdominal tenderness. There is no right CVA tenderness, left CVA tenderness, guarding or rebound.     Hernia: No hernia is present.  Musculoskeletal:        General: No swelling, tenderness, deformity  or signs of injury. Normal range of motion.     Cervical back: Normal range of motion and neck supple. No rigidity or tenderness.     Right lower leg: No edema.     Left lower leg: No edema.  Lymphadenopathy:     Cervical: No cervical adenopathy.  Skin:    General: Skin is warm and dry.     Coloration: Skin is not jaundiced or pale.     Findings: No bruising, erythema, lesion or rash.  Neurological:     Mental Status: She is alert and oriented to person, place, and time. Mental status is at baseline.     Gait: Gait normal.  Psychiatric:        Mood and Affect: Mood normal.        Behavior: Behavior normal.        Thought Content: Thought content normal.        Judgment: Judgment normal.      No results found for any visits on 06/23/23.  Assessment & Plan    Routine Health Maintenance and Physical Exam  Exercise Activities and Dietary recommendations  Goals   None      There is no immunization history on file for this patient.  Health Maintenance  Topic Date Due   COVID-19 Vaccine (1) Never done   HPV Vaccine (1 - 3-dose series) Never done   HIV Screening  Never done   Hepatitis C Screening  Never done   DTaP/Tdap/Td vaccine (1 - Tdap) Never done   Pap Smear  Never done   Flu Shot  07/05/2023*  *Topic was postponed. The date shown is not the original due date.    Discussed  health benefits of physical activity, and encouraged her to engage in regular exercise appropriate for her age and condition.  Assessment and Plan              UTD on dental/eye Things to do to keep yourself healthy  - Exercise at least 30-45 minutes a day, 3-4 days a week.  - Eat a low-fat diet with lots of fruits and vegetables, up to 7-9 servings per day.  - Seatbelts can save your life. Wear them always.  - Smoke detectors on every level of your home, check batteries every year.  - Eye Doctor - have an eye exam every 1-2 years  - Safe sex - if you may be exposed to STDs, use a  condom.  - Alcohol -  If you drink, do it moderately, less than 2 drinks per day.  - Health Care Power of Attorney. Choose someone to speak for you if you are not able.  - Depression is common in our stressful world.If you're feeling down or losing interest in things you normally enjoy, please come in for a visit.  - Violence - If anyone is threatening or hurting you, please call immediately.   No follow-ups on file.    The patient was advised to call back or seek an in-person evaluation if the symptoms worsen or if the condition fails to improve as anticipated.  I discussed the assessment and treatment plan with the patient. The patient was provided an opportunity to ask questions and all were answered. The patient agreed with the plan and demonstrated an understanding of the instructions.  I, Debera Lat, PA-C have reviewed all documentation for this visit. The documentation on 06/23/2023  for the exam, diagnosis, procedures, and orders are all accurate and complete.  Debera Lat, [facility], MMS [facility] [phone] (phone) [phone] (fax)  [facility]

## 2023-06-22 NOTE — Telephone Encounter (Signed)
 Please see the message below for the pt. A referral has been place but the pt hasn't heard from anyone to get scheduled

## 2023-06-23 ENCOUNTER — Ambulatory Visit: Payer: Self-pay | Admitting: Physician Assistant

## 2023-06-23 VITALS — BP 116/66 | HR 83 | Resp 16 | Ht 61.0 in | Wt 136.6 lb

## 2023-06-23 DIAGNOSIS — J301 Allergic rhinitis due to pollen: Secondary | ICD-10-CM

## 2023-06-23 DIAGNOSIS — Z0001 Encounter for general adult medical examination with abnormal findings: Secondary | ICD-10-CM | POA: Diagnosis not present

## 2023-06-23 DIAGNOSIS — R739 Hyperglycemia, unspecified: Secondary | ICD-10-CM

## 2023-06-23 DIAGNOSIS — Z0000 Encounter for general adult medical examination without abnormal findings: Secondary | ICD-10-CM

## 2023-06-23 DIAGNOSIS — E785 Hyperlipidemia, unspecified: Secondary | ICD-10-CM | POA: Diagnosis not present

## 2023-06-23 MED ORDER — FLUTICASONE PROPIONATE 50 MCG/ACT NA SUSP: 2.0000 | Freq: Every day | NASAL | 1 refills | Status: AC

## 2023-06-23 MED ORDER — CETIRIZINE HCL 10 MG PO TABS: 10.0000 mg | ORAL_TABLET | Freq: Every day | ORAL | 11 refills | Status: AC

## 2023-06-28 DIAGNOSIS — R7989 Other specified abnormal findings of blood chemistry: Secondary | ICD-10-CM | POA: Diagnosis not present

## 2023-07-14 ENCOUNTER — Ambulatory Visit: Admitting: Physician Assistant

## 2023-07-29 ENCOUNTER — Other Ambulatory Visit: Payer: Self-pay | Admitting: Physician Assistant

## 2023-07-29 DIAGNOSIS — Z30019 Encounter for initial prescription of contraceptives, unspecified: Secondary | ICD-10-CM

## 2023-07-30 NOTE — Telephone Encounter (Signed)
 Requested Prescriptions  Pending Prescriptions Disp Refills   SPRINTEC 28 0.25-35 MG-MCG tablet [Pharmacy Med Name: Sprintec 28 0.25-35 MG-MCG Oral Tablet] 84 tablet 0    Sig: Take 1 tablet by mouth once daily     OB/GYN:  Contraceptives Passed - 07/30/2023  9:51 AM      Passed - Last BP in normal range    BP Readings from Last 1 Encounters:  06/23/23 116/66         Passed - Valid encounter within last 12 months    Recent Outpatient Visits           1 month ago Annual physical exam   [facility] Potter, Janna, PA-C              Passed - Patient is not a smoker

## 2023-08-12 DIAGNOSIS — R569 Unspecified convulsions: Secondary | ICD-10-CM | POA: Diagnosis not present

## 2023-09-06 DIAGNOSIS — F419 Anxiety disorder, unspecified: Secondary | ICD-10-CM | POA: Diagnosis not present

## 2023-09-06 DIAGNOSIS — F439 Reaction to severe stress, unspecified: Secondary | ICD-10-CM | POA: Diagnosis not present

## 2023-09-06 DIAGNOSIS — R569 Unspecified convulsions: Secondary | ICD-10-CM | POA: Diagnosis not present

## 2023-09-06 DIAGNOSIS — Z1331 Encounter for screening for depression: Secondary | ICD-10-CM | POA: Diagnosis not present

## 2023-09-25 ENCOUNTER — Other Ambulatory Visit: Payer: Self-pay | Admitting: Physician Assistant

## 2023-09-25 DIAGNOSIS — Z30019 Encounter for initial prescription of contraceptives, unspecified: Secondary | ICD-10-CM

## 2023-12-14 ENCOUNTER — Other Ambulatory Visit: Payer: Self-pay | Admitting: Physician Assistant

## 2023-12-14 DIAGNOSIS — Z30019 Encounter for initial prescription of contraceptives, unspecified: Secondary | ICD-10-CM

## 2024-01-12 DIAGNOSIS — L508 Other urticaria: Secondary | ICD-10-CM | POA: Diagnosis not present

## 2024-01-12 DIAGNOSIS — J029 Acute pharyngitis, unspecified: Secondary | ICD-10-CM | POA: Diagnosis not present

## 2024-01-14 DIAGNOSIS — R051 Acute cough: Secondary | ICD-10-CM | POA: Diagnosis not present

## 2024-01-14 DIAGNOSIS — L508 Other urticaria: Secondary | ICD-10-CM | POA: Diagnosis not present

## 2024-03-05 ENCOUNTER — Other Ambulatory Visit: Payer: Self-pay | Admitting: Physician Assistant

## 2024-03-05 DIAGNOSIS — Z30019 Encounter for initial prescription of contraceptives, unspecified: Secondary | ICD-10-CM

## 2024-03-07 ENCOUNTER — Telehealth: Payer: Self-pay | Admitting: Physician Assistant

## 2024-03-07 DIAGNOSIS — F419 Anxiety disorder, unspecified: Secondary | ICD-10-CM | POA: Diagnosis not present

## 2024-03-07 DIAGNOSIS — G40309 Generalized idiopathic epilepsy and epileptic syndromes, not intractable, without status epilepticus: Secondary | ICD-10-CM | POA: Diagnosis not present

## 2024-03-07 DIAGNOSIS — F439 Reaction to severe stress, unspecified: Secondary | ICD-10-CM | POA: Diagnosis not present

## 2024-03-07 NOTE — Telephone Encounter (Signed)
 RX REFILL REQUEST FOR SPRINTEC 28 TAB 28 DAY

## 2024-03-07 NOTE — Telephone Encounter (Signed)
 This is duplicate order. Previous request has been sent to PCP for review on 03/06/24.

## 2024-06-23 ENCOUNTER — Encounter: Admitting: Physician Assistant
